# Patient Record
Sex: Female | Born: 1980 | Race: Black or African American | Hispanic: No | Marital: Single | Smoking: Former smoker
Health system: Southern US, Community
[De-identification: ages and names within clinical notes are randomized; demographics above are authoritative.]

## PROBLEM LIST (undated history)

## (undated) DIAGNOSIS — D649 Anemia, unspecified: Secondary | ICD-10-CM

## (undated) HISTORY — PX: NO PAST SURGERIES: SHX2092

---

## 2019-10-10 LAB — PREGNANCY, URINE: Preg Test, Ur: POSITIVE

## 2019-10-31 ENCOUNTER — Other Ambulatory Visit: Payer: Self-pay

## 2019-10-31 ENCOUNTER — Encounter (HOSPITAL_COMMUNITY): Payer: Self-pay | Admitting: Emergency Medicine

## 2019-10-31 ENCOUNTER — Inpatient Hospital Stay (HOSPITAL_COMMUNITY)
Admission: EM | Admit: 2019-10-31 | Discharge: 2019-10-31 | Discharge status: Against Medical Advice | Payer: Medicare Other | Attending: Family Medicine | Admitting: Family Medicine

## 2019-10-31 DIAGNOSIS — O26891 Other specified pregnancy related conditions, first trimester: Secondary | ICD-10-CM | POA: Insufficient documentation

## 2019-10-31 DIAGNOSIS — Z3A00 Weeks of gestation of pregnancy not specified: Secondary | ICD-10-CM | POA: Insufficient documentation

## 2019-10-31 DIAGNOSIS — R109 Unspecified abdominal pain: Secondary | ICD-10-CM | POA: Insufficient documentation

## 2019-10-31 NOTE — ED Provider Notes (Signed)
MSE was initiated and I personally evaluated the patient and placed orders (if any) at  1:13 PM on October 31, 2019.  39 year old female [id number] who is approximately 3 months pregnant presents to the ED with complaint of sharp lower abdominal pain that began last night around 9 PM. Pt reports the pain lasted about 1 hour before dissipating on its own. When she woke up this morning she had worsening pain. Denies fevers, chills, nausea, vomiting, vaginal bleeding/spotting, rush of fluids, diarrhea, or any other associated symptoms. Pt has not taken anything for pain.   Physical Exam  Constitutional: She is well-developed, well-nourished, and in no distress.  HENT:  Head: Normocephalic and atraumatic.  Eyes: Conjunctivae are normal.  Cardiovascular: Normal rate, regular rhythm and normal heart sounds.  Pulmonary/Chest: Effort normal and breath sounds normal. No respiratory distress. She has no wheezes. She has no rales.  Abdominal: Soft. There is abdominal tenderness. There is no rebound and no guarding.  Musculoskeletal:     Cervical back: Normal range of motion.  Skin: Skin is warm and dry.  Nursing note and vitals reviewed.  Spoke with Erin at [facility] who agrees to accept patient given positive pregnancy. Pt to be transferred for further eval.   The patient appears stable so that the remainder of the MSE may be completed by another provider.   Tanda Rockers, PA-C 10/31/19 1315    Melene Plan, DO 10/31/19 1341

## 2019-10-31 NOTE — ED Triage Notes (Signed)
Pt reports being 3 months pregnant, started having sharp lower abd pains last night.

## 2019-10-31 NOTE — Progress Notes (Addendum)
House coverage came to talk with patient about childcare for her 39 year old and the patient decided to leave AMA before triage and come back later when she had someone to watch her child. AMA form signed and put with medical records.

## 2019-10-31 NOTE — Progress Notes (Signed)
Pt arrived to [facility]U with 39 year old child with her. States she is living with her uncle and he does not get off of work until 3:30. Called [facility] house coverage to come speak to the patient about childcare.

## 2019-12-19 ENCOUNTER — Emergency Department (HOSPITAL_COMMUNITY)
Admission: EM | Admit: 2019-12-19 | Discharge: 2019-12-19 | Disposition: A | Discharge status: Home / Self Care | Payer: Medicare Other | Attending: Emergency Medicine | Admitting: Emergency Medicine

## 2019-12-19 ENCOUNTER — Encounter (HOSPITAL_COMMUNITY): Payer: Self-pay | Admitting: Emergency Medicine

## 2019-12-19 ENCOUNTER — Other Ambulatory Visit: Payer: Self-pay

## 2019-12-19 DIAGNOSIS — R8271 Bacteriuria: Secondary | ICD-10-CM | POA: Diagnosis not present

## 2019-12-19 DIAGNOSIS — R531 Weakness: Secondary | ICD-10-CM | POA: Diagnosis not present

## 2019-12-19 DIAGNOSIS — O99891 Other specified diseases and conditions complicating pregnancy: Secondary | ICD-10-CM | POA: Diagnosis not present

## 2019-12-19 LAB — URINALYSIS, ROUTINE W REFLEX MICROSCOPIC
Bilirubin Urine: NEGATIVE
Glucose, UA: NEGATIVE mg/dL
Hgb urine dipstick: NEGATIVE
Ketones, ur: NEGATIVE mg/dL
Nitrite: NEGATIVE
Protein, ur: NEGATIVE mg/dL
Specific Gravity, Urine: 1.024 (ref 1.005–1.030)
pH: 5 (ref 5.0–8.0)

## 2019-12-19 LAB — COMPREHENSIVE METABOLIC PANEL
ALT: 13 U/L (ref 0–44)
AST: 14 U/L — ABNORMAL LOW (ref 15–41)
Albumin: 3.1 g/dL — ABNORMAL LOW (ref 3.5–5.0)
Alkaline Phosphatase: 53 U/L (ref 38–126)
Anion gap: 7 (ref 5–15)
BUN: 10 mg/dL (ref 6–20)
CO2: 24 mmol/L (ref 22–32)
Calcium: 8.9 mg/dL (ref 8.9–10.3)
Chloride: 104 mmol/L (ref 98–111)
Creatinine, Ser: 0.62 mg/dL (ref 0.44–1.00)
GFR calc Af Amer: >60 mL/min (ref >60)
GFR calc non Af Amer: >60 mL/min (ref >60)
Glucose, Bld: 97 mg/dL (ref 70–99)
Potassium: 4.2 mmol/L (ref 3.5–5.1)
Sodium: 135 mmol/L (ref 135–145)
Total Bilirubin: 0.3 mg/dL (ref 0.3–1.2)
Total Protein: 6.7 g/dL (ref 6.5–8.1)

## 2019-12-19 LAB — CBC WITH DIFFERENTIAL/PLATELET
Abs Immature Granulocytes: 0.03 10*3/uL (ref 0.00–0.07)
Basophils Absolute: 0 10*3/uL (ref 0.0–0.1)
Basophils Relative: 0 %
Eosinophils Absolute: 0.1 10*3/uL (ref 0.0–0.5)
Eosinophils Relative: 1 %
HCT: 29.8 % — ABNORMAL LOW (ref 36.0–46.0)
Hemoglobin: 9.3 g/dL — ABNORMAL LOW (ref 12.0–15.0)
Immature Granulocytes: 0 %
Lymphocytes Relative: 23 %
Lymphs Abs: 1.7 10*3/uL (ref 0.7–4.0)
MCH: 28.2 pg (ref 26.0–34.0)
MCHC: 31.2 g/dL (ref 30.0–36.0)
MCV: 90.3 fL (ref 80.0–100.0)
Monocytes Absolute: 0.5 10*3/uL (ref 0.1–1.0)
Monocytes Relative: 7 %
Neutro Abs: 5 10*3/uL (ref 1.7–7.7)
Neutrophils Relative %: 69 %
Platelets: 232 10*3/uL (ref 150–400)
RBC: 3.3 MIL/uL — ABNORMAL LOW (ref 3.87–5.11)
RDW: 14.1 % (ref 11.5–15.5)
WBC: 7.3 10*3/uL (ref 4.0–10.5)
nRBC: 0 % (ref 0.0–0.2)

## 2019-12-19 MED ORDER — CEPHALEXIN 500 MG PO CAPS: 500.0000 mg | ORAL_CAPSULE | Freq: Two times a day (BID) | ORAL | 0 refills | Status: AC

## 2019-12-19 MED ORDER — SODIUM CHLORIDE 0.9 % IV BOLUS
1000.0000 mL | Freq: Once | INTRAVENOUS | Status: AC
  Administered 2019-12-19: 1000 mL via INTRAVENOUS

## 2019-12-19 MED ORDER — PRENATAL 27-1 MG PO TABS: 1.0000 | ORAL_TABLET | Freq: Every day | ORAL | 1 refills | Status: DC

## 2019-12-19 MED ORDER — CEPHALEXIN 500 MG PO CAPS
500.0000 mg | ORAL_CAPSULE | Freq: Once | ORAL | Status: AC
  Administered 2019-12-19: 500 mg via ORAL
  Filled 2019-12-19: qty 1

## 2019-12-19 NOTE — ED Triage Notes (Signed)
Patient states weakness and dizziness that started around 18:30 today. Patient is 5 months pregnant. Patient states that she has not felt the baby move as much today and states that she has been without her prenatal vitamin for 1 month and needing a prescription for her prenatal vitamin. Patient denies any pain or bleeding.

## 2019-12-19 NOTE — Discharge Instructions (Signed)
You were seen in the emergency department today with weakness during pregnancy.  Your urine today showed some bacteria and when you are pregnant we like to treat this with antibiotics.  I have also refilled your prenatal vitamins.  Please call your OB/GYN first thing tomorrow morning to schedule your follow-up appointment and return to the emergency department any new or suddenly worsening symptoms.

## 2019-12-19 NOTE — ED Provider Notes (Signed)
Emergency Department Provider Note   I have reviewed the triage vital signs and the nursing notes.   HISTORY  Chief Complaint Weakness (dizziness)   HPI Renee Blair is a 39 y.o. female [phone] currently 5 months pregnant [place] to the emergency department with fatigue and generalized weakness starting today.  Patient states she has been out of her pre-natal vitamin and thinks this may be the cause.  She tells me that the baby has been moving but seemed somewhat less than normal.  She is not having abdominal pain or cramping.  No vaginal bleeding, discharge, rush of fluid.  No dysuria, hesitancy, urgency.  She denies any chest pain or shortness of breath.  No fevers. No complications with this pregnancy thus far and reports seeing the [facility] for pre-natal care is [place].    History reviewed. No pertinent past medical history.  There are no problems to display for this patient.   History reviewed. No pertinent surgical history.  Allergies Patient has no known allergies.  History reviewed. No pertinent family history.  Social History Social History   Tobacco Use  . Smoking status: Never Smoker  . Smokeless tobacco: Never Used  Substance Use Topics  . Alcohol use: Not Currently  . Drug use: Never    Review of Systems  Constitutional: No fever/chills. Positive weakness.  Eyes: No visual changes. ENT: No sore throat. Cardiovascular: Denies chest pain. Respiratory: Denies shortness of breath. Gastrointestinal: No abdominal pain.  No nausea, no vomiting.  No diarrhea.  No constipation. Genitourinary: Negative for dysuria. Musculoskeletal: Negative for back pain. Skin: Negative for rash. Neurological: Negative for headaches, focal weakness or numbness.  10-point ROS otherwise negative.  ____________________________________________   PHYSICAL EXAM:  VITAL SIGNS: ED Triage Vitals  Enc Vitals Group     BP 12/19/19 1934 106/68     Pulse Rate  12/19/19 1934 87     Resp 12/19/19 1934 18     Temp 12/19/19 1934 98.1 F (36.7 C)     Temp Source 12/19/19 1934 Oral     SpO2 12/19/19 1934 100 %     Weight 12/19/19 1935 180 lb (81.6 kg)     Height 12/19/19 1935 5\' 2"  (1.575 m)   Constitutional: Alert and oriented. Well appearing and in no acute distress. Eyes: Conjunctivae are normal. Head: Atraumatic. Nose: No congestion/rhinnorhea. Mouth/Throat: Mucous membranes are moist.   Neck: No stridor.  Cardiovascular: Normal rate, regular rhythm. Good peripheral circulation. Grossly normal heart sounds.   Respiratory: Normal respiratory effort.  No retractions. Lungs CTAB. Gastrointestinal: Soft and nontender. Abdomen is gravid.  Musculoskeletal: No gross deformities of extremities. Neurologic:  Normal speech and language.  Skin:  Skin is warm, dry and intact. No rash noted.   ____________________________________________   LABS (all labs ordered are listed, but only abnormal results are displayed)  Labs Reviewed  COMPREHENSIVE METABOLIC PANEL - Abnormal; Notable for the following components:      Result Value   Albumin 3.1 (*)    AST 14 (*)    All other components within normal limits  CBC WITH DIFFERENTIAL/PLATELET - Abnormal; Notable for the following components:   RBC 3.30 (*)    Hemoglobin 9.3 (*)    HCT 29.8 (*)    All other components within normal limits  URINALYSIS, ROUTINE W REFLEX MICROSCOPIC - Abnormal; Notable for the following components:   APPearance HAZY (*)    Leukocytes,Ua TRACE (*)    Bacteria, UA RARE (*)  All other components within normal limits  URINE CULTURE   ____________________________________________  RADIOLOGY  None  ____________________________________________   PROCEDURES  Procedure(s) performed:   Procedures  None  ____________________________________________   INITIAL IMPRESSION / ASSESSMENT AND PLAN / ED COURSE  Pertinent labs & imaging results that were available during  my care of the patient were reviewed by me and considered in my medical decision making (see chart for details).   Patient presents to the emergency department for evaluation of generalized weakness starting today.  No focal neuro deficits on exam.  Abdomen is gravid and soft.  No symptoms to suspect preterm labor or rupture of membranes.  Will place mom and baby on monitor, obtain screening blood work, UA, IV fluids. BP normal here.   Doppler fetal heart tones in normal range.  Mom is feeling baby kick and move.  Labs show no significant anemia.  No protein in the urine.  Normal blood pressure here.  UA does have bacteriuria without clear infection.  In the setting of pregnancy will add Keflex and have refilled the prenatal vitamins.  Patient to call her OB/GYN in the morning to schedule follow-up. Discussed ED return precautions.  ____________________________________________  FINAL CLINICAL IMPRESSION(S) / ED DIAGNOSES  Final diagnoses:  Generalized weakness  Bacteriuria during pregnancy     MEDICATIONS GIVEN DURING THIS VISIT:  Medications  cephALEXin (KEFLEX) capsule 500 mg (has no administration in time range)  sodium chloride 0.9 % bolus 1,000 mL (0 mLs Intravenous Stopped 12/19/19 2157)     NEW OUTPATIENT MEDICATIONS STARTED DURING THIS VISIT:  New Prescriptions   CEPHALEXIN (KEFLEX) 500 MG CAPSULE    Take 1 capsule (500 mg total) by mouth 2 (two) times daily for 7 days.    Note:  This document was prepared using Dragon voice recognition software and may include unintentional dictation errors.  Nanda Quinton, MD, [facility] Emergency Medicine    Krisy Dix, Wonda Olds, MD 12/19/19 2213

## 2019-12-21 LAB — URINE CULTURE

## 2019-12-27 ENCOUNTER — Ambulatory Visit (INDEPENDENT_AMBULATORY_CARE_PROVIDER_SITE_OTHER): Payer: Medicare Other | Admitting: *Deleted

## 2019-12-27 ENCOUNTER — Other Ambulatory Visit: Payer: Self-pay

## 2019-12-27 DIAGNOSIS — O09529 Supervision of elderly multigravida, unspecified trimester: Secondary | ICD-10-CM

## 2019-12-27 DIAGNOSIS — O0992 Supervision of high risk pregnancy, unspecified, second trimester: Secondary | ICD-10-CM

## 2019-12-27 DIAGNOSIS — O0990 Supervision of high risk pregnancy, unspecified, unspecified trimester: Secondary | ICD-10-CM

## 2019-12-27 DIAGNOSIS — O0932 Supervision of pregnancy with insufficient antenatal care, second trimester: Secondary | ICD-10-CM

## 2019-12-27 DIAGNOSIS — Z3A20 20 weeks gestation of pregnancy: Secondary | ICD-10-CM

## 2019-12-27 DIAGNOSIS — O0930 Supervision of pregnancy with insufficient antenatal care, unspecified trimester: Secondary | ICD-10-CM | POA: Insufficient documentation

## 2019-12-27 NOTE — Progress Notes (Signed)
I connected with  Renee Blair on 12/27/19 at  3:15 PM EDT by telephone and verified that I am speaking with the correct person using two identifiers.   I discussed the limitations, risks, security and privacy concerns of performing an evaluation and management service by telephone and the availability of in person appointments. I also discussed with the patient that there may be a patient responsible charge related to this service. The patient expressed understanding and agreed to proceed.  I explained I am completing her New OB Intake today. We discussed Her EDD and that it is based on  sure LMP. She is G10 P4054 at [redacted]w[redacted]d with AMA.   I reviewed her allergies, meds, OB History, Medical /Surgical history, and appropriate screenings. I informed her of [facility] services.  I explained I will send her the Babyscripts app and app was sent to her while on phone. She states she will download when she gets home; she is in park with her children.   I explained we will ask her to take her blood pressure weekly during her pregnancy and enter into the app. She is unsure if she has Medicaid only or Medicare/ Medicaid. She will bring her information to her first new ob visit in the office and I explained our registar will clarify her insurance and we will order a blood pressure cuff prescription if covered by her insurance.  I explained she will have some visits in office and some virtually. I sent her a MyChart text and she will download later tonight when she is home.   I reviewed her new ob  appointment date/ time with her , our location and to wear mask, no visitors.  I explained she will have a pelvic exam, ob bloodwork, hemoglobin a1C, cbg ,pap, and  genetic testing if desired,- she does want a panorama. I scheduled an [place] for first available appointment and gave her the appointment. She voices understanding.   Tej Murdaugh,RN 12/27/2019  4:00

## 2019-12-27 NOTE — Patient Instructions (Signed)

## 2020-01-04 ENCOUNTER — Ambulatory Visit (INDEPENDENT_AMBULATORY_CARE_PROVIDER_SITE_OTHER): Payer: Medicare Other | Admitting: Obstetrics and Gynecology

## 2020-01-04 ENCOUNTER — Encounter: Payer: Self-pay | Admitting: Obstetrics and Gynecology

## 2020-01-04 ENCOUNTER — Other Ambulatory Visit (HOSPITAL_COMMUNITY)
Admission: RE | Admit: 2020-01-04 | Discharge: 2020-01-04 | Disposition: A | Discharge status: Home / Self Care | Payer: Medicare Other | Source: Ambulatory Visit | Attending: Obstetrics and Gynecology | Admitting: Obstetrics and Gynecology

## 2020-01-04 ENCOUNTER — Other Ambulatory Visit: Payer: Self-pay

## 2020-01-04 DIAGNOSIS — O0990 Supervision of high risk pregnancy, unspecified, unspecified trimester: Secondary | ICD-10-CM

## 2020-01-04 DIAGNOSIS — O98512 Other viral diseases complicating pregnancy, second trimester: Secondary | ICD-10-CM

## 2020-01-04 DIAGNOSIS — M25569 Pain in unspecified knee: Secondary | ICD-10-CM

## 2020-01-04 DIAGNOSIS — G8929 Other chronic pain: Secondary | ICD-10-CM

## 2020-01-04 DIAGNOSIS — Z641 Problems related to multiparity: Secondary | ICD-10-CM

## 2020-01-04 DIAGNOSIS — Z3A21 21 weeks gestation of pregnancy: Secondary | ICD-10-CM

## 2020-01-04 DIAGNOSIS — B009 Herpesviral infection, unspecified: Secondary | ICD-10-CM

## 2020-01-04 DIAGNOSIS — O99891 Other specified diseases and conditions complicating pregnancy: Secondary | ICD-10-CM

## 2020-01-04 NOTE — Patient Instructions (Signed)
Leg Cramps Leg cramps occur when one or more muscles tighten and you have no control over this tightening (involuntary muscle contraction). Muscle cramps can develop in any muscle, but the most common place is in the calf muscles of the leg. Those cramps can occur during exercise or when you are at rest. Leg cramps are painful, and they may last for a few seconds to a few minutes. Cramps may return several times before they finally stop. Usually, leg cramps are not caused by a serious medical problem. In many cases, the cause is not known. Some common causes include:  Excessive physical effort (overexertion), such as during intense exercise.  Overuse from repetitive motions, or doing the same thing over and over.  Staying in a certain position for a long period of time.  Improper preparation, form, or technique while performing a sport or an activity.  Dehydration.  Injury.  Side effects of certain medicines.  Abnormally low levels of minerals in your blood (electrolytes), especially potassium and calcium. This could result from: ? Pregnancy. ? Taking diuretic medicines. Follow these instructions at home: Eating and drinking  Drink enough fluid to keep your urine pale yellow. Staying hydrated may help prevent cramps.  Eat a healthy diet that includes plenty of nutrients to help your muscles function. A healthy diet includes fruits and vegetables, lean protein, whole grains, and low-fat or nonfat dairy products. Managing pain, stiffness, and swelling      Try massaging, stretching, and relaxing the affected muscle. Do this for several minutes at a time.  If directed, put ice on areas that are sore or painful after a cramp: ? Put ice in a plastic bag. ? Place a towel between your skin and the bag. ? Leave the ice on for 20 minutes, 2-3 times a day.  If directed, apply heat to muscles that are tense or tight. Do this before you exercise, or as often as told by your health care  provider. Use the heat source that your health care provider recommends, such as a moist heat pack or a heating pad. ? Place a towel between your skin and the heat source. ? Leave the heat on for 20-30 minutes. ? Remove the heat if your skin turns bright red. This is especially important if you are unable to feel pain, heat, or cold. You may have a greater risk of getting burned.  Try taking hot showers or baths to help relax tight muscles. General instructions  If you are having frequent leg cramps, avoid intense exercise for several days.  Take over-the-counter and prescription medicines only as told by your health care provider.  Keep all follow-up visits as told by your health care provider. This is important. Contact a health care provider if:  Your leg cramps get more severe or more frequent, or they do not improve over time.  Your foot becomes cold, numb, or blue. Summary  Muscle cramps can develop in any muscle, but the most common place is in the calf muscles of the leg.  Leg cramps are painful, and they may last for a few seconds to a few minutes.  Usually, leg cramps are not caused by a serious medical problem. Often, the cause is not known.  Stay hydrated and take over-the-counter and prescription medicines only as told by your health care provider. This information is not intended to replace advice given to you by your health care provider. Make sure you discuss any questions you have with your health care   provider. Document Revised: 07/02/2017 Document Reviewed: 04/29/2017 Elsevier Patient Education  2020 Elsevier Inc.  

## 2020-01-04 NOTE — Progress Notes (Addendum)
History:   Renee Blair is a 39 y.o. [id number] at [redacted]w[redacted]d by uncertain LMP being seen today for her first obstetrical visit.  Her obstetrical history is significant for advanced maternal age, late to care.  Patient does not intend to breast feed. Pregnancy history fully reviewed.   Patient is requesting a letter for disability to be in a first floor apartment building. She does not have a PCP and has never been evaluated for her leg pain. States she has had leg pain since her first pregnancy, however has never had this evaluated.   Patient reports Leg pain .  HISTORY: OB History  Gravida Para Term Preterm AB Living  8 4 4  0 3 4  SAB TAB Ectopic Multiple Live Births  1 2 0 0 4    # Outcome Date GA Lbr Len/2nd Weight Sex Delivery Anes PTL Lv  8 Current           7 SAB 01/03/17          6 Term 2017   7 lb (3.175 kg)  Vag-Spont EPI  LIV     Birth Comments: wnl  5 TAB 2007     TAB     4 Term 2006   7 lb (3.175 kg)  Vag-Spont EPI  LIV     Birth Comments: wnl  3 Term 2004   7 lb (3.175 kg)  Vag-Spont EPI  LIV     Birth Comments: wnl  2 Term 2003   7 lb (3.175 kg)  Vag-Spont EPI  LIV     Birth Comments: wnl  1 TAB 2002     TAB       Last pap smear was done 1 year ago, June 2020, and was normal  History reviewed. No pertinent past medical history. History reviewed. No pertinent surgical history. Family History  Problem Relation Age of Onset  . Hypertension Mother   . Diabetes Mother    Social History   Tobacco Use  . Smoking status: Never Smoker  . Smokeless tobacco: Never Used  Substance Use Topics  . Alcohol use: Not Currently  . Drug use: Never   No Known Allergies Current Outpatient Medications on File Prior to Visit  Medication Sig Dispense Refill  . acetaminophen (TYLENOL) 500 MG tablet Take 500 mg by mouth every 6 (six) hours as needed for mild pain or moderate pain.    . Prenatal 27-1 MG TABS Take 1 tablet by mouth daily. 30 tablet 1   No current  facility-administered medications on file prior to visit.    Review of Systems Pertinent items noted in HPI and remainder of comprehensive ROS otherwise negative. Physical Exam:   Vitals:   01/04/20 1544  BP: 107/70  Pulse: 99  Weight: 208 lb 6.4 oz (94.5 kg)   Fetal Heart Rate (bpm): 149   Uterus:  Fundal Height: 27 cm  System: General: well-developed, well-nourished female in no acute distress   Skin: normal coloration and turgor, no rashes   Neurologic: oriented, normal, negative, normal mood   Extremities: normal strength, tone, and muscle mass, ROM of all joints is normal   HEENT PERRLA, extraocular movement intact and sclera clear, anicteric   Mouth/Teeth mucous membranes moist, pharynx normal without lesions and dental hygiene good   Neck supple and no masses   Cardiovascular: regular rate and rhythm   Respiratory:  no respiratory distress, normal breath sounds   Abdomen: soft, non-tender; bowel sounds normal; no masses,  no organomegaly  Assessment:    Pregnancy: [id number] Patient Active Problem List   Diagnosis Date Noted  . [place] multiparity 01/08/2020  . 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 Chronic knee pain 01/04/2020  . Supervision of high risk pregnancy, antepartum 12/27/2019  . AMA (advanced maternal age) multigravida 35+ 12/27/2019  . Late prenatal care 12/27/2019     Plan:   1. Supervision of high risk pregnancy, antepartum  - Culture, OB Urine - Genetic Screening - CBC/D/Plt+RPR+Rh+ABO+Rub Ab... - AFP, Serum, Open Spina Bifida - HgB A1c - Cervicovaginal ancillary only( [place]) - Size greater than dates.   2. HSV (herpes simplex virus) infection  - HgB A1c - Suppression at 36 weeks   3. Chronic knee pain, unspecified laterality  - Patient needs to be evaluated by a PCP for her knee pain. I informed patient that I was not able to fill out any legal paperwork given that this is a non-OBGYN complaint. List of PCP's given and  discussed.  - HgB A1c   Initial labs drawn. Continue prenatal vitamins. Problem list reviewed and updated. Genetic Screening discussed, NIPS: requested. Ultrasound discussed; fetal anatomic survey: ordered. Discussed usage of Babyscripts and virtual visits as additional source of managing and completing prenatal visits in midst of coronavirus and pandemic.   Anticipatory guidance for prenatal visits including labs, ultrasounds, and testing; Initial labs drawn. Encouraged to complete MyChart Registration for her ability to review results, send requests, and have questions addressed.  The nature of [place] for [facility]Faculty Practice with multiple MDs and Advanced Practice Providers was explained to patient; also emphasized that residents, students are part of our team. Routine obstetric precautions reviewed. Encouraged to seek out care at office or emergency room [facility] preferred) for urgent and/or emergent concerns. No follow-ups on file.     Mitchel Delduca, Artist Pais, [place] for [employer], [place]

## 2020-01-05 ENCOUNTER — Telehealth: Payer: Self-pay

## 2020-01-05 NOTE — Telephone Encounter (Signed)
Called pt back as I advised that I would after speaking with Venia Carbon regarding their conversation that they had regarding the letter stating that she has a leg disability & she will need to have a downstairs apartment. No answer, left message that Victorino Dike said that she explained to pt that she could not evaluate her situation & that she really needs to see a PCP because it may be something serious & she's not able to write the letter that she needs. She stated Pt did get kind of upset when she told her this.Advised Pt if she still wants to talk with some one Gave her Mayra Neer name & phone number.

## 2020-01-05 NOTE — Telephone Encounter (Signed)
Called pt to advise that we are not able to create a letter stating that she is disabled or unable to climb stairs due to her chronic knee pain. Pt states that the provider specifically told her that she could give her a note for the Apartment complex, in order for her to get a first floor apartment. Advised Pt that I will speak with provider directly, Victorino Dike Rasch,NP & call her back.

## 2020-01-08 DIAGNOSIS — Z641 Problems related to multiparity: Secondary | ICD-10-CM | POA: Insufficient documentation

## 2020-01-08 LAB — CERVICOVAGINAL ANCILLARY ONLY
Bacterial Vaginitis (gardnerella): NEGATIVE
Candida Glabrata: NEGATIVE
Candida Vaginitis: NEGATIVE
Chlamydia: NEGATIVE
Comment: NEGATIVE
Comment: NEGATIVE
Comment: NEGATIVE
Comment: NEGATIVE
Comment: NEGATIVE
Comment: NORMAL
Neisseria Gonorrhea: NEGATIVE
Trichomonas: NEGATIVE

## 2020-01-09 ENCOUNTER — Telehealth (INDEPENDENT_AMBULATORY_CARE_PROVIDER_SITE_OTHER): Payer: Medicare Other | Admitting: Family Medicine

## 2020-01-09 DIAGNOSIS — N898 Other specified noninflammatory disorders of vagina: Secondary | ICD-10-CM

## 2020-01-09 DIAGNOSIS — Z712 Person consulting for explanation of examination or test findings: Secondary | ICD-10-CM

## 2020-01-09 MED ORDER — METRONIDAZOLE 0.75 % VA GEL: 1.0000 | Freq: Every day | VAGINAL | 0 refills | Status: DC

## 2020-01-09 NOTE — Telephone Encounter (Signed)
Patient called in stating that she would like the results from her test/lab work that she received last week. Patient instructed that a message will be sent to the nurses and they will contact her as soon as they can. Patient verbalized understanding and message sent to clinical pool.

## 2020-01-09 NOTE — Telephone Encounter (Signed)
Called pt to discuss results. Explained to pt that many of her labs have not resulted. Pt states she has BV. Reports foul, fishy vaginal odor. This is new since visit on 01/04/20 when vaginal swab was negative for BV. Metrogel sent to patient's preferred pharmacy. Pt encouraged to call if symptoms persist following treatment.

## 2020-01-09 NOTE — Addendum Note (Signed)
Addended by: Maxwell Marion E on: 01/09/2020 11:38 AM   Modules accepted: Orders

## 2020-01-11 LAB — AFP, SERUM, OPEN SPINA BIFIDA
AFP Value: 32.4 ng/mL
Gest. Age on Collection Date: 21 weeks
Maternal Age At EDD: 36.7 yr

## 2020-01-11 LAB — HCV INTERPRETATION

## 2020-01-11 LAB — CBC/D/PLT+RPR+RH+ABO+RUB AB...
Antibody Screen: NEGATIVE
Basophils Absolute: 0 10*3/uL (ref 0.0–0.2)
Basos: 0 %
EOS (ABSOLUTE): 0.1 10*3/uL (ref 0.0–0.4)
Eos: 1 %
HCV Ab: <0.1 s/co ratio (ref 0.0–0.9)
HIV Screen 4th Generation wRfx: NONREACTIVE
Hematocrit: 29.4 % — ABNORMAL LOW (ref 34.0–46.6)
Hemoglobin: 10.1 g/dL — ABNORMAL LOW (ref 11.1–15.9)
Hepatitis B Surface Ag: NEGATIVE
Immature Grans (Abs): 0 10*3/uL (ref 0.0–0.1)
Immature Granulocytes: 1 %
Lymphocytes Absolute: 1.6 10*3/uL (ref 0.7–3.1)
Lymphs: 22 %
MCH: 28.9 pg (ref 26.6–33.0)
MCHC: 34.4 g/dL (ref 31.5–35.7)
MCV: 84 fL (ref 79–97)
Monocytes Absolute: 0.4 10*3/uL (ref 0.1–0.9)
Monocytes: 6 %
Neutrophils Absolute: 5.1 10*3/uL (ref 1.4–7.0)
Neutrophils: 70 %
Platelets: 245 10*3/uL (ref 150–450)
RBC: 3.5 x10E6/uL — ABNORMAL LOW (ref 3.77–5.28)
RDW: 14 % (ref 11.7–15.4)
RPR Ser Ql: NONREACTIVE
Rh Factor: POSITIVE
Rubella Antibodies, IGG: 8.52 index (ref >0.99)
WBC: 7.1 10*3/uL (ref 3.4–10.8)

## 2020-01-11 LAB — HEMOGLOBIN A1C
Est. average glucose Bld gHb Est-mCnc: 108 mg/dL
Hgb A1c MFr Bld: 5.4 % (ref 4.8–5.6)

## 2020-01-17 ENCOUNTER — Ambulatory Visit: Payer: Medicare Other

## 2020-01-17 ENCOUNTER — Other Ambulatory Visit: Payer: Self-pay

## 2020-01-18 ENCOUNTER — Encounter: Payer: Self-pay | Admitting: Obstetrics and Gynecology

## 2020-01-18 DIAGNOSIS — O99019 Anemia complicating pregnancy, unspecified trimester: Secondary | ICD-10-CM | POA: Insufficient documentation

## 2020-01-19 ENCOUNTER — Encounter: Payer: Self-pay | Admitting: *Deleted

## 2020-01-25 ENCOUNTER — Ambulatory Visit: Payer: Medicare Other | Admitting: *Deleted

## 2020-01-25 ENCOUNTER — Ambulatory Visit: Payer: Medicare Other | Attending: Obstetrics and Gynecology

## 2020-01-25 ENCOUNTER — Other Ambulatory Visit: Payer: Self-pay | Admitting: *Deleted

## 2020-01-25 ENCOUNTER — Other Ambulatory Visit: Payer: Self-pay

## 2020-01-25 ENCOUNTER — Ambulatory Visit (INDEPENDENT_AMBULATORY_CARE_PROVIDER_SITE_OTHER): Payer: Medicare Other

## 2020-01-25 DIAGNOSIS — O0942 Supervision of pregnancy with grand multiparity, second trimester: Secondary | ICD-10-CM

## 2020-01-25 DIAGNOSIS — O09529 Supervision of elderly multigravida, unspecified trimester: Secondary | ICD-10-CM | POA: Diagnosis present

## 2020-01-25 DIAGNOSIS — O0932 Supervision of pregnancy with insufficient antenatal care, second trimester: Secondary | ICD-10-CM | POA: Diagnosis not present

## 2020-01-25 DIAGNOSIS — B009 Herpesviral infection, unspecified: Secondary | ICD-10-CM | POA: Diagnosis present

## 2020-01-25 DIAGNOSIS — O0930 Supervision of pregnancy with insufficient antenatal care, unspecified trimester: Secondary | ICD-10-CM

## 2020-01-25 DIAGNOSIS — Z641 Problems related to multiparity: Secondary | ICD-10-CM | POA: Insufficient documentation

## 2020-01-25 DIAGNOSIS — O0990 Supervision of high risk pregnancy, unspecified, unspecified trimester: Secondary | ICD-10-CM

## 2020-01-25 DIAGNOSIS — Z3A23 23 weeks gestation of pregnancy: Secondary | ICD-10-CM

## 2020-01-25 DIAGNOSIS — Z363 Encounter for antenatal screening for malformations: Secondary | ICD-10-CM

## 2020-01-25 DIAGNOSIS — O09522 Supervision of elderly multigravida, second trimester: Secondary | ICD-10-CM

## 2020-01-25 DIAGNOSIS — O98512 Other viral diseases complicating pregnancy, second trimester: Secondary | ICD-10-CM

## 2020-01-25 DIAGNOSIS — Z789 Other specified health status: Secondary | ICD-10-CM

## 2020-01-25 DIAGNOSIS — Z712 Person consulting for explanation of examination or test findings: Secondary | ICD-10-CM

## 2020-01-25 NOTE — Progress Notes (Signed)
Pt left urine sample for a UA.  UA resulted negative.  Pt notified of negative UA.  Pt verbalized understanding with no further questions or concerns.   Addison Naegeli, RN

## 2020-01-26 LAB — POCT URINALYSIS DIP (DEVICE)
Bilirubin Urine: NEGATIVE
Glucose, UA: NEGATIVE mg/dL
Hgb urine dipstick: NEGATIVE
Ketones, ur: NEGATIVE mg/dL
Leukocytes,Ua: NEGATIVE
Nitrite: NEGATIVE
Protein, ur: NEGATIVE mg/dL
Specific Gravity, Urine: 1.025 (ref 1.005–1.030)
Urobilinogen, UA: 0.2 mg/dL (ref 0.0–1.0)
pH: 5.5 (ref 5.0–8.0)

## 2020-01-31 ENCOUNTER — Inpatient Hospital Stay (HOSPITAL_COMMUNITY)
Admission: AD | Admit: 2020-01-31 | Discharge: 2020-01-31 | Disposition: A | Discharge status: Home / Self Care | Payer: Medicare Other | Attending: Obstetrics and Gynecology | Admitting: Obstetrics and Gynecology

## 2020-01-31 ENCOUNTER — Other Ambulatory Visit: Payer: Self-pay

## 2020-01-31 ENCOUNTER — Encounter (HOSPITAL_COMMUNITY): Payer: Self-pay | Admitting: Obstetrics and Gynecology

## 2020-01-31 DIAGNOSIS — O09522 Supervision of elderly multigravida, second trimester: Secondary | ICD-10-CM | POA: Diagnosis not present

## 2020-01-31 DIAGNOSIS — Z833 Family history of diabetes mellitus: Secondary | ICD-10-CM | POA: Diagnosis not present

## 2020-01-31 DIAGNOSIS — Z3A24 24 weeks gestation of pregnancy: Secondary | ICD-10-CM

## 2020-01-31 DIAGNOSIS — O0930 Supervision of pregnancy with insufficient antenatal care, unspecified trimester: Secondary | ICD-10-CM

## 2020-01-31 DIAGNOSIS — Z8249 Family history of ischemic heart disease and other diseases of the circulatory system: Secondary | ICD-10-CM | POA: Diagnosis not present

## 2020-01-31 DIAGNOSIS — O99012 Anemia complicating pregnancy, second trimester: Secondary | ICD-10-CM

## 2020-01-31 DIAGNOSIS — Z641 Problems related to multiparity: Secondary | ICD-10-CM

## 2020-01-31 DIAGNOSIS — O9A312 Physical abuse complicating pregnancy, second trimester: Secondary | ICD-10-CM | POA: Diagnosis not present

## 2020-01-31 DIAGNOSIS — Z87891 Personal history of nicotine dependence: Secondary | ICD-10-CM | POA: Diagnosis not present

## 2020-01-31 DIAGNOSIS — O368120 Decreased fetal movements, second trimester, not applicable or unspecified: Secondary | ICD-10-CM

## 2020-01-31 DIAGNOSIS — B009 Herpesviral infection, unspecified: Secondary | ICD-10-CM

## 2020-01-31 DIAGNOSIS — O0990 Supervision of high risk pregnancy, unspecified, unspecified trimester: Secondary | ICD-10-CM

## 2020-01-31 HISTORY — DX: Anemia, unspecified: D64.9

## 2020-01-31 LAB — URINALYSIS, ROUTINE W REFLEX MICROSCOPIC
Bilirubin Urine: NEGATIVE
Glucose, UA: NEGATIVE mg/dL
Hgb urine dipstick: NEGATIVE
Ketones, ur: 20 mg/dL — AB
Leukocytes,Ua: NEGATIVE
Nitrite: NEGATIVE
Protein, ur: 30 mg/dL — AB
Specific Gravity, Urine: 1.026 (ref 1.005–1.030)
pH: 5 (ref 5.0–8.0)
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
## 2020-01-31 NOTE — Discharge Instructions (Signed)
Abdominal Pain During Pregnancy  Belly (abdominal) pain is common during pregnancy. There are many possible causes. Most of the time, it is not a serious problem. Other times, it can be a sign that something is wrong with the pregnancy. Always tell your doctor if you have belly pain. Follow these instructions at home:  Do not have sex or put anything in your vagina until your pain goes away completely.  Get plenty of rest until your pain gets better.  Drink enough fluid to keep your pee (urine) pale yellow.  Take over-the-counter and prescription medicines only as told by your doctor.  Keep all follow-up visits as told by your doctor. This is important. Contact a doctor if:  Your pain continues or gets worse after resting.  You have lower belly pain that: ? Comes and goes at regular times. ? Spreads to your back. ? Feels like menstrual cramps.  You have pain or burning when you pee (urinate). Get help right away if:  You have a fever or chills.  You have vaginal bleeding.  You are leaking fluid from your vagina.  You are passing tissue from your vagina.  You throw up (vomit) for more than 24 hours.  You have watery poop (diarrhea) for more than 24 hours.  Your baby is moving less than usual.  You feel very weak or faint.  You have shortness of breath.  You have very bad pain in your upper belly. Summary  Belly (abdominal) pain is common during pregnancy. There are many possible causes.  If you have belly pain during pregnancy, tell your doctor right away.  Keep all follow-up visits as told by your doctor. This is important. This information is not intended to replace advice given to you by your health care provider. Make sure you discuss any questions you have with your health care provider. Document Revised: 11/07/2018 Document Reviewed: 10/22/2016 Elsevier Patient Education  2020 Elsevier Inc.  
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
## 2020-02-01 ENCOUNTER — Telehealth (INDEPENDENT_AMBULATORY_CARE_PROVIDER_SITE_OTHER): Payer: Medicare Other | Admitting: Family Medicine

## 2020-02-01 DIAGNOSIS — Z5329 Procedure and treatment not carried out because of patient's decision for other reasons: Secondary | ICD-10-CM

## 2020-02-01 DIAGNOSIS — Z91199 Patient's noncompliance with other medical treatment and regimen due to unspecified reason: Secondary | ICD-10-CM

## 2020-02-01 NOTE — Progress Notes (Signed)
Patient did not keep appointment today. She will be called to reschedule.  

## 2020-02-01 NOTE — Progress Notes (Signed)
Called pt at [phone]; VM left stating I am calling to check pt in for virtual pt and will call again in 10 minutes. Pt encouraged to join MyChart for visit.

## 2020-02-06 ENCOUNTER — Encounter: Payer: Self-pay | Admitting: General Practice

## 2020-02-12 ENCOUNTER — Encounter: Payer: Self-pay | Admitting: General Practice

## 2020-02-21 ENCOUNTER — Encounter: Payer: Self-pay | Admitting: Obstetrics and Gynecology

## 2020-02-21 ENCOUNTER — Telehealth (INDEPENDENT_AMBULATORY_CARE_PROVIDER_SITE_OTHER): Payer: Medicare Other | Admitting: Obstetrics and Gynecology

## 2020-02-21 DIAGNOSIS — B009 Herpesviral infection, unspecified: Secondary | ICD-10-CM

## 2020-02-21 DIAGNOSIS — O09522 Supervision of elderly multigravida, second trimester: Secondary | ICD-10-CM

## 2020-02-21 DIAGNOSIS — O09523 Supervision of elderly multigravida, third trimester: Secondary | ICD-10-CM

## 2020-02-21 DIAGNOSIS — O0990 Supervision of high risk pregnancy, unspecified, unspecified trimester: Secondary | ICD-10-CM

## 2020-02-21 DIAGNOSIS — O99012 Anemia complicating pregnancy, second trimester: Secondary | ICD-10-CM

## 2020-02-21 DIAGNOSIS — O0993 Supervision of high risk pregnancy, unspecified, third trimester: Secondary | ICD-10-CM

## 2020-02-21 DIAGNOSIS — D649 Anemia, unspecified: Secondary | ICD-10-CM

## 2020-02-21 DIAGNOSIS — O98513 Other viral diseases complicating pregnancy, third trimester: Secondary | ICD-10-CM

## 2020-02-21 MED ORDER — PRENATAL 27-1 MG PO TABS: 1.0000 | ORAL_TABLET | Freq: Every day | ORAL | 4 refills | Status: AC

## 2020-02-21 MED ORDER — FERROUS SULFATE 325 (65 FE) MG PO TABS: 325.0000 mg | ORAL_TABLET | Freq: Two times a day (BID) | ORAL | 1 refills | Status: AC

## 2020-02-21 MED ORDER — BLOOD PRESSURE KIT DEVI: 1.0000 | 0 refills | Status: AC | PRN

## 2020-02-21 NOTE — Progress Notes (Signed)
Patient ID: Renee Blair, female   DOB: 08-12-1980, 39 y.o.   MRN: [id number] Patient was assessed and managed by nursing staff during this encounter. I have reviewed the chart and agree with the documentation and plan. I have also made any necessary editorial changes.  Scheryl Darter, MD 02/21/2020 1:21 PM

## 2020-02-21 NOTE — Progress Notes (Signed)
° °  TELEHEALTH OBSTETRICS VISIT ENCOUNTER NOTE  I connected with Renee Blair on 02/21/20 at  2:35 PM EDT by telephone at home and verified that I am speaking with the correct person using two identifiers.   I discussed the limitations, risks, security and privacy concerns of performing an evaluation and management service by telephone and the availability of in person appointments. I also discussed with the patient that there may be a patient responsible charge related to this service. The patient expressed understanding and agreed to proceed.  Subjective:  Renee Blair is a 39 y.o. [id number] at [redacted]w[redacted]d being followed for ongoing prenatal care.  She is currently monitored for the following issues for this high-risk pregnancy and has Supervision of high risk pregnancy, antepartum; AMA (advanced maternal age) multigravida 35+; Late prenatal care; HSV (herpes simplex virus) infection; Chronic knee pain; [place] multiparity; and Anemia in pregnancy on their problem list.  Patient reports no complaints. Reports fetal movement. Denies any contractions, bleeding or leaking of fluid.   The following portions of the patient's history were reviewed and updated as appropriate: allergies, current medications, past family history, past medical history, past social history, past surgical history and problem list.   Objective:   General:  Alert, oriented and cooperative.   Mental Status: Normal mood and affect perceived. Normal judgment and thought content.  Rest of physical exam deferred due to type of encounter  Assessment and Plan:  Pregnancy: [id number] at [redacted]w[redacted]d  1. Supervision of high risk pregnancy, antepartum EDD changed to LMP, pt sure and within 1 week of EDC by 23 week [place]  2. Multigravida of advanced maternal age in second trimester  3. HSV (herpes simplex virus) infection Will need ppx  4. Anemia during pregnancy in second trimester Ferrous sulfate sent to pharmacy  Preterm labor symptoms  and general obstetric precautions including but not limited to vaginal bleeding, contractions, leaking of fluid and fetal movement were reviewed in detail with the patient.  I discussed the assessment and treatment plan with the patient. The patient was provided an opportunity to ask questions and all were answered. The patient agreed with the plan and demonstrated an understanding of the instructions. The patient was advised to call back or seek an in-person office evaluation/go to MAU at [facility] for any urgent or concerning symptoms. Please refer to After Visit Summary for other counseling recommendations.   I provided 12 minutes of non-face-to-face time during this encounter.  Return in about 2 weeks (around 03/06/2020) for high OB, in person, 2 hr GTT, 3rd trim labs.  Future Appointments  Date Time Provider Department Center  02/22/2020  2:00 PM [facility] NURSE [facility]  02/22/2020  2:00 PM [facility]    Conan Bowens, MD Center for [facility], [facility]

## 2020-02-21 NOTE — Progress Notes (Signed)
I connected with  Merion Gary on 02/21/20 at  2:35 PM EDT by telephone and verified that I am speaking with the correct person using two identifiers.   I discussed the limitations, risks, security and privacy concerns of performing an evaluation and management service by telephone and the availability of in person appointments. I also discussed with the patient that there may be a patient responsible charge related to this service. The patient expressed understanding and agreed to proceed.  Ralene Bathe, RN 02/21/2020  2:49 PM

## 2020-02-22 ENCOUNTER — Ambulatory Visit: Payer: Medicare Other

## 2020-02-22 ENCOUNTER — Encounter (HOSPITAL_COMMUNITY): Payer: Self-pay | Admitting: Emergency Medicine

## 2020-02-22 ENCOUNTER — Other Ambulatory Visit: Payer: Self-pay

## 2020-02-22 ENCOUNTER — Emergency Department (HOSPITAL_COMMUNITY)
Admission: EM | Admit: 2020-02-22 | Discharge: 2020-02-22 | Disposition: A | Discharge status: Home / Self Care | Payer: Medicare Other | Attending: Emergency Medicine | Admitting: Emergency Medicine

## 2020-02-22 DIAGNOSIS — Z87891 Personal history of nicotine dependence: Secondary | ICD-10-CM | POA: Diagnosis not present

## 2020-02-22 DIAGNOSIS — Y929 Unspecified place or not applicable: Secondary | ICD-10-CM | POA: Insufficient documentation

## 2020-02-22 DIAGNOSIS — Y9389 Activity, other specified: Secondary | ICD-10-CM | POA: Insufficient documentation

## 2020-02-22 DIAGNOSIS — Z3A00 Weeks of gestation of pregnancy not specified: Secondary | ICD-10-CM | POA: Diagnosis not present

## 2020-02-22 DIAGNOSIS — O9A213 Injury, poisoning and certain other consequences of external causes complicating pregnancy, third trimester: Secondary | ICD-10-CM | POA: Insufficient documentation

## 2020-02-22 DIAGNOSIS — Y999 Unspecified external cause status: Secondary | ICD-10-CM | POA: Diagnosis not present

## 2020-02-22 DIAGNOSIS — S61259A Open bite of unspecified finger without damage to nail, initial encounter: Secondary | ICD-10-CM

## 2020-02-22 DIAGNOSIS — S61011A Laceration without foreign body of right thumb without damage to nail, initial encounter: Secondary | ICD-10-CM | POA: Diagnosis not present

## 2020-02-22 DIAGNOSIS — S6991XA Unspecified injury of right wrist, hand and finger(s), initial encounter: Secondary | ICD-10-CM | POA: Diagnosis present

## 2020-02-22 MED ORDER — ACETAMINOPHEN 500 MG PO TABS: 500.0000 mg | ORAL_TABLET | Freq: Four times a day (QID) | ORAL | 0 refills | Status: AC | PRN

## 2020-02-22 MED ORDER — LIDOCAINE HCL 2 % IJ SOLN
10.0000 mL | Freq: Once | INTRAMUSCULAR | Status: AC
  Administered 2020-02-22: 200 mg via INTRADERMAL
  Filled 2020-02-22: qty 20

## 2020-02-22 MED ORDER — AMOXICILLIN-POT CLAVULANATE 875-125 MG PO TABS: 1.0000 | ORAL_TABLET | Freq: Two times a day (BID) | ORAL | 0 refills | Status: AC

## 2020-02-22 MED ORDER — ACETAMINOPHEN 500 MG PO TABS
1000.0000 mg | ORAL_TABLET | Freq: Once | ORAL | Status: AC
  Administered 2020-02-22: 1000 mg via ORAL
  Filled 2020-02-22: qty 2
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
## 2020-02-22 NOTE — ED Notes (Signed)
Fetal heart tones 147 BPM
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
## 2020-02-22 NOTE — Discharge Instructions (Addendum)
You have been treated for finger injury.  Absorbable sutures were used to fix your finger laceration. It does not need to be removed. Take antibiotic as prescribed as bite wound has a high likelihood of getting infected.  Return if you have any concerns.

## 2020-02-27 ENCOUNTER — Telehealth (INDEPENDENT_AMBULATORY_CARE_PROVIDER_SITE_OTHER): Payer: Medicare Other | Admitting: Family Medicine

## 2020-02-27 DIAGNOSIS — N898 Other specified noninflammatory disorders of vagina: Secondary | ICD-10-CM

## 2020-02-27 DIAGNOSIS — O99891 Other specified diseases and conditions complicating pregnancy: Secondary | ICD-10-CM

## 2020-02-27 NOTE — Telephone Encounter (Signed)
Patient is calling requesting a nurse call her back today, she would not give me the details other then its regarding her RX.

## 2020-02-27 NOTE — Telephone Encounter (Signed)
Returned patients call.   Patient reports she had BV and was treated in June. She reports she has a foul odor to her Vagina but no discharge, burning, or itching. Advised patient she needs to come in for a swab to determine if she has an infection. Patient voiced understanding.   Message to front desk to schedule pt for nurse visit for vaginal swab.

## 2020-02-28 ENCOUNTER — Ambulatory Visit: Payer: Medicare Other | Admitting: *Deleted

## 2020-02-28 ENCOUNTER — Ambulatory Visit (INDEPENDENT_AMBULATORY_CARE_PROVIDER_SITE_OTHER): Payer: Medicare Other | Admitting: *Deleted

## 2020-02-28 ENCOUNTER — Ambulatory Visit (HOSPITAL_BASED_OUTPATIENT_CLINIC_OR_DEPARTMENT_OTHER): Payer: Medicare Other

## 2020-02-28 ENCOUNTER — Other Ambulatory Visit: Payer: Self-pay

## 2020-02-28 ENCOUNTER — Other Ambulatory Visit (HOSPITAL_COMMUNITY)
Admission: RE | Admit: 2020-02-28 | Discharge: 2020-02-28 | Disposition: A | Discharge status: Home / Self Care | Payer: Medicare Other | Source: Ambulatory Visit | Attending: Obstetrics & Gynecology | Admitting: Obstetrics & Gynecology

## 2020-02-28 VITALS — BP 113/71 | HR 102

## 2020-02-28 DIAGNOSIS — O0930 Supervision of pregnancy with insufficient antenatal care, unspecified trimester: Secondary | ICD-10-CM

## 2020-02-28 DIAGNOSIS — B009 Herpesviral infection, unspecified: Secondary | ICD-10-CM

## 2020-02-28 DIAGNOSIS — Z641 Problems related to multiparity: Secondary | ICD-10-CM | POA: Insufficient documentation

## 2020-02-28 DIAGNOSIS — Z362 Encounter for other antenatal screening follow-up: Secondary | ICD-10-CM

## 2020-02-28 DIAGNOSIS — O98513 Other viral diseases complicating pregnancy, third trimester: Secondary | ICD-10-CM

## 2020-02-28 DIAGNOSIS — O09529 Supervision of elderly multigravida, unspecified trimester: Secondary | ICD-10-CM | POA: Insufficient documentation

## 2020-02-28 DIAGNOSIS — O0990 Supervision of high risk pregnancy, unspecified, unspecified trimester: Secondary | ICD-10-CM

## 2020-02-28 DIAGNOSIS — N898 Other specified noninflammatory disorders of vagina: Secondary | ICD-10-CM | POA: Diagnosis not present

## 2020-02-28 DIAGNOSIS — O0933 Supervision of pregnancy with insufficient antenatal care, third trimester: Secondary | ICD-10-CM | POA: Diagnosis not present

## 2020-02-28 DIAGNOSIS — O09523 Supervision of elderly multigravida, third trimester: Secondary | ICD-10-CM

## 2020-02-28 DIAGNOSIS — O99891 Other specified diseases and conditions complicating pregnancy: Secondary | ICD-10-CM

## 2020-02-28 DIAGNOSIS — O99012 Anemia complicating pregnancy, second trimester: Secondary | ICD-10-CM | POA: Insufficient documentation

## 2020-02-28 DIAGNOSIS — O0943 Supervision of pregnancy with grand multiparity, third trimester: Secondary | ICD-10-CM | POA: Diagnosis not present

## 2020-02-28 DIAGNOSIS — Z3A28 28 weeks gestation of pregnancy: Secondary | ICD-10-CM

## 2020-02-28 NOTE — Progress Notes (Signed)
Pt reports having vaginal odor x1 week. She denies discharge or irritation. Self swab obtained. Pt was advised that she will be notified of results and any treatment indicated via Mychart. She voiced understanding.

## 2020-02-28 NOTE — Progress Notes (Signed)
Agree with A & P. 

## 2020-02-29 ENCOUNTER — Other Ambulatory Visit: Payer: Self-pay | Admitting: *Deleted

## 2020-02-29 DIAGNOSIS — O09523 Supervision of elderly multigravida, third trimester: Secondary | ICD-10-CM

## 2020-03-01 LAB — CERVICOVAGINAL ANCILLARY ONLY
Bacterial Vaginitis (gardnerella): NEGATIVE
Candida Glabrata: NEGATIVE
Candida Vaginitis: NEGATIVE
Chlamydia: NEGATIVE
Comment: NEGATIVE
Comment: NEGATIVE
Comment: NEGATIVE
Comment: NEGATIVE
Comment: NEGATIVE
Comment: NORMAL
Neisseria Gonorrhea: NEGATIVE
Trichomonas: NEGATIVE

## 2020-03-06 ENCOUNTER — Other Ambulatory Visit: Payer: Medicare Other

## 2020-03-06 ENCOUNTER — Encounter: Payer: Self-pay | Admitting: Obstetrics & Gynecology

## 2020-03-06 ENCOUNTER — Other Ambulatory Visit: Payer: Self-pay | Admitting: General Practice

## 2020-03-06 ENCOUNTER — Encounter: Payer: Medicare Other | Admitting: Obstetrics & Gynecology

## 2020-03-06 DIAGNOSIS — O0990 Supervision of high risk pregnancy, unspecified, unspecified trimester: Secondary | ICD-10-CM

## 2020-03-10 ENCOUNTER — Other Ambulatory Visit: Payer: Self-pay

## 2020-03-10 ENCOUNTER — Encounter (HOSPITAL_COMMUNITY): Payer: Self-pay | Admitting: *Deleted

## 2020-03-10 ENCOUNTER — Ambulatory Visit (HOSPITAL_COMMUNITY)
Admission: EM | Admit: 2020-03-10 | Discharge: 2020-03-10 | Disposition: A | Discharge status: Home / Self Care | Payer: Medicare Other | Attending: Emergency Medicine | Admitting: Emergency Medicine

## 2020-03-10 DIAGNOSIS — N898 Other specified noninflammatory disorders of vagina: Secondary | ICD-10-CM | POA: Diagnosis not present

## 2020-03-10 NOTE — Discharge Instructions (Signed)
We will notify of you any positive findings or if any changes to treatment are needed. If normal or otherwise without concern to your results, we will not call you. Please log on to your MyChart to review your results if interested in so.   

## 2020-03-10 NOTE — ED Provider Notes (Signed)
[place]    CSN: [id number] Arrival date & time: 03/10/20  1306      History   Chief Complaint Chief Complaint  Patient presents with   Vaginal Discharge    HPI Renee Blair is a 39 y.o. female.   Renee Blair presents with complaints of vaginal discharge. Fishy odor. History of BV and feels similar. Discharge is thick, unknown color. No itching. Pregnant, 30 weeks. No vaginal bleeding. No abdominal pain. Good fetal movement. No concern for stds. Next OB 9/6. Was seen 7/28 for same, cytology was negative at that time therefore no medications given.    ROS per HPI, negative if not otherwise mentioned.      Past Medical History:  Diagnosis Date   Anemia     Patient Active Problem List   Diagnosis Date Noted   Anemia in pregnancy 01/18/2020   Grand multiparity 01/08/2020   HSV (herpes simplex virus) infection 01/04/2020   Chronic knee pain 01/04/2020   Supervision of high risk pregnancy, antepartum 12/27/2019   AMA (advanced maternal age) multigravida 35+ 12/27/2019   Late prenatal care 12/27/2019    Past Surgical History:  Procedure Laterality Date   NO PAST SURGERIES      OB History    Gravida  8   Para  4   Term  4   Preterm      AB  3   Living  4     SAB  1   TAB  2   Ectopic      Multiple      Live Births  4            Home Medications    Prior to Admission medications   Medication Sig Start Date End Date Taking? Authorizing Provider  ferrous sulfate (FERROUSUL) 325 (65 FE) MG tablet Take 1 tablet (325 mg total) by mouth 2 (two) times daily. 02/21/20  Yes Sloan Leiter, MD  Prenatal 27-1 MG TABS Take 1 tablet by mouth daily. 02/21/20  Yes Sloan Leiter, MD  acetaminophen (TYLENOL) 500 MG tablet Take 1 tablet (500 mg total) by mouth every 6 (six) hours as needed for mild pain or moderate pain. 02/22/20   Domenic Moras, PA-C  amoxicillin-clavulanate (AUGMENTIN) 875-125 MG tablet Take 1 tablet by mouth 2  (two) times daily. One po bid x 7 days 02/22/20   Domenic Moras, PA-C  Blood Pressure Monitoring (BLOOD PRESSURE KIT) DEVI 1 Device by Does not apply route as needed. 02/21/20   Sloan Leiter, MD    Family History Family History  Problem Relation Age of Onset   Hypertension Mother    Diabetes Mother     Social History Social History   Tobacco Use   Smoking status: Former Smoker    Types: Cigars    Quit date: 01/25/2019    Years since quitting: 1.1   Smokeless tobacco: Never Used  Vaping Use   Vaping Use: Never used  Substance Use Topics   Alcohol use: Not Currently   Drug use: Never     Allergies   Patient has no known allergies.   Review of Systems Review of Systems   Physical Exam Triage Vital Signs ED Triage Vitals  Enc Vitals Group     BP 03/10/20 1357 118/77     Pulse Rate 03/10/20 1357 88     Resp 03/10/20 1357 18     Temp 03/10/20 1357 98.4 F (36.9 C)     Temp  Source 03/10/20 1357 Oral     SpO2 03/10/20 1357 100 %     Weight --      Height --      Head Circumference --      Peak Flow --      Pain Score 03/10/20 1356 0     Pain Loc --      Pain Edu? --      Excl. in [place]? --    No data found.  Updated Vital Signs BP 118/77    Pulse 88    Temp 98.4 F (36.9 C) (Oral)    Resp 18    LMP 08/04/2019    SpO2 100%   Visual Acuity Right Eye Distance:   Left Eye Distance:   Bilateral Distance:    Right Eye Near:   Left Eye Near:    Bilateral Near:     Physical Exam Constitutional:      General: She is not in acute distress.    Appearance: She is well-developed.  Cardiovascular:     Rate and Rhythm: Normal rate.  Pulmonary:     Effort: Pulmonary effort is normal.  Abdominal:     Palpations: Abdomen is not rigid.     Tenderness: There is no abdominal tenderness. There is no guarding or rebound.  Genitourinary:    Comments: Denies sores, lesions, vaginal bleeding; no pelvic pain; gu exam deferred at this time, vaginal self swab collected.    Skin:    General: Skin is warm and dry.  Neurological:     Mental Status: She is alert and oriented to person, place, and time.      UC Treatments / Results  Labs (all labs ordered are listed, but only abnormal results are displayed) Labs Reviewed  CERVICOVAGINAL ANCILLARY ONLY    EKG   Radiology No results found.  Procedures Procedures (including critical care time)  Medications Ordered in UC Medications - No data to display  Initial Impression / Assessment and Plan / UC Course  I have reviewed the triage vital signs and the nursing notes.  Pertinent labs & imaging results that were available during my care of the patient were reviewed by me and considered in my medical decision making (see chart for details).     Patient requesting empiric treatment for BV. Tested negative 7/28, she is [redacted] weeks pregnant, discussed with patient that I will await testing results before treatment to avoid unnecessary medications. This was unsatisfying news to her. We will notify her of any positive test findings and promptly initiate appropriate medications. Encouraged to follow up with gyne prn. Patient verbalized understanding.    Final Clinical Impressions(s) / UC Diagnoses   Final diagnoses:  Vaginal discharge     Discharge Instructions     We will notify of you any positive findings or if any changes to treatment are needed. If normal or otherwise without concern to your results, we will not call you. Please log on to your MyChart to review your results if interested in so.       ED Prescriptions    None     PDMP not reviewed this encounter.   Zigmund Gottron, NP 03/10/20 2241

## 2020-03-10 NOTE — ED Triage Notes (Signed)
Pt is [redacted] wks pregnant.  C/O malodorous vaginal discharge x approx 1 wk.  Denies any abd pain or vaginal bleeding.  Denies fevers.

## 2020-03-11 LAB — CERVICOVAGINAL ANCILLARY ONLY
Bacterial Vaginitis (gardnerella): NEGATIVE
Candida Glabrata: NEGATIVE
Candida Vaginitis: NEGATIVE
Chlamydia: NEGATIVE
Comment: NEGATIVE
Comment: NEGATIVE
Comment: NEGATIVE
Comment: NEGATIVE
Comment: NEGATIVE
Comment: NORMAL
Neisseria Gonorrhea: NEGATIVE
Trichomonas: NEGATIVE

## 2020-04-10 ENCOUNTER — Ambulatory Visit: Payer: Medicare Other

## 2020-04-10 ENCOUNTER — Ambulatory Visit: Payer: Medicare Other | Attending: Obstetrics and Gynecology

## 2020-04-16 ENCOUNTER — Encounter: Payer: Self-pay | Admitting: General Practice

## 2021-11-04 IMAGING — US US MFM OB FOLLOW-UP
1 series · 13 of 28 positions shown · non-contrast
Comparison: none

[Series 1: us mfm ob follow-up · 13 of 61 slices shown]
[im 3/61]
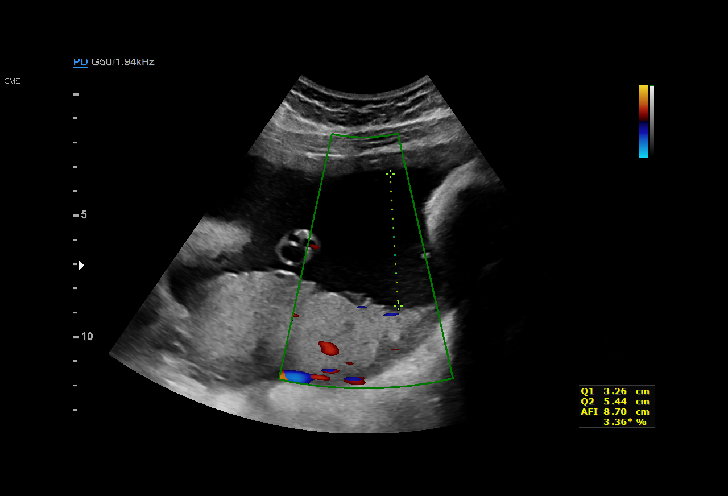
[im 7/61]
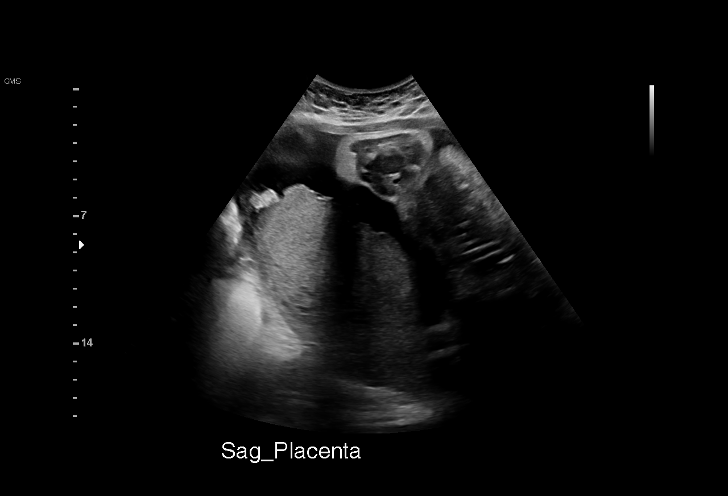
[im 12/61]
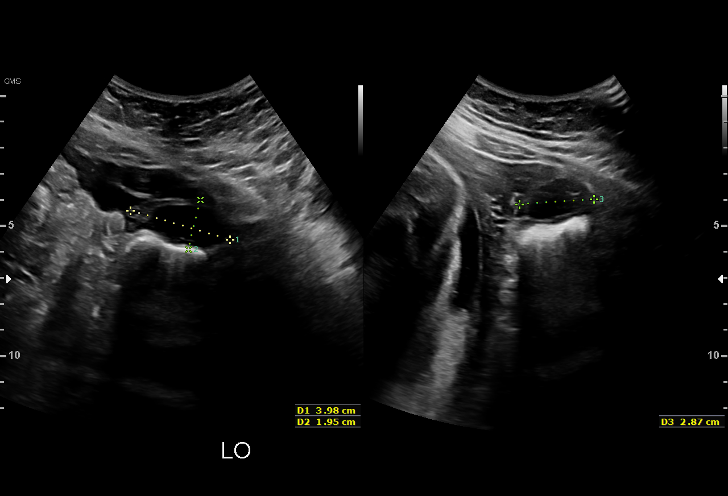
[im 16/61]
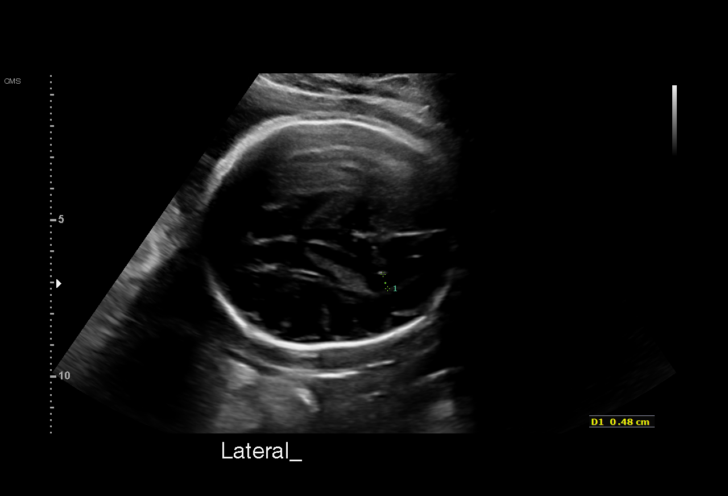
[im 21/61]
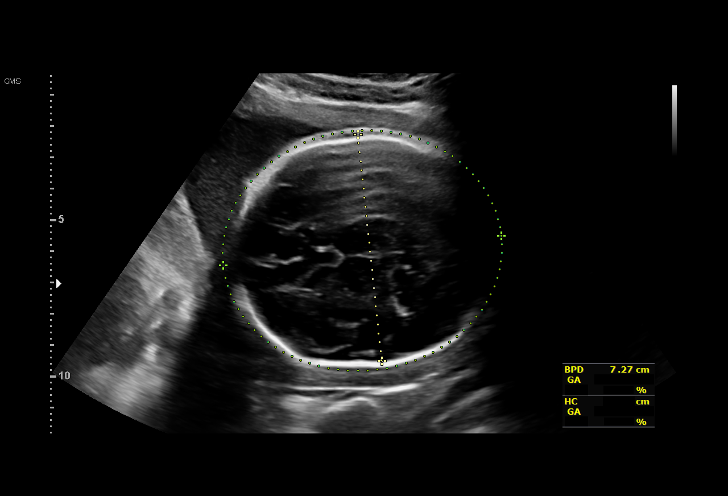
[im 25/61]
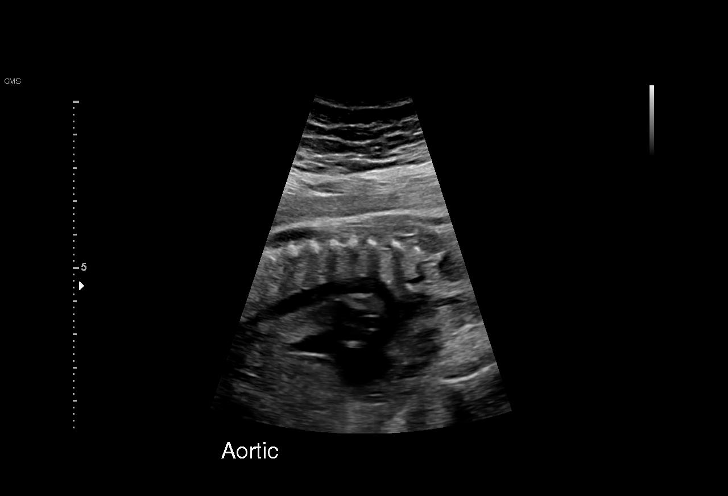
[im 32/61]
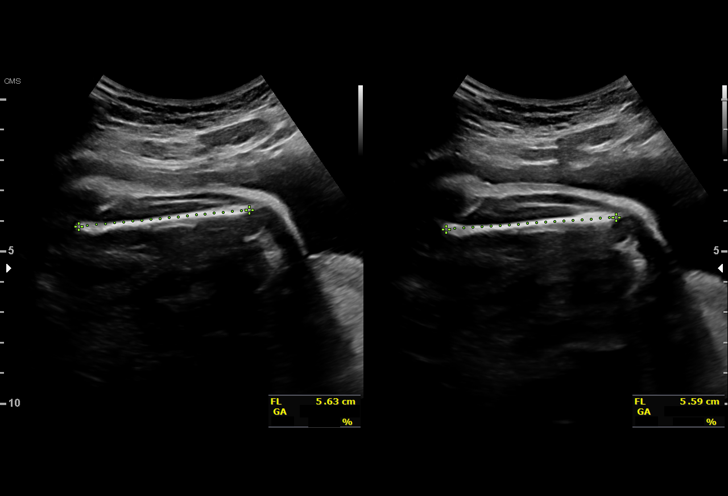
[im 36/61]
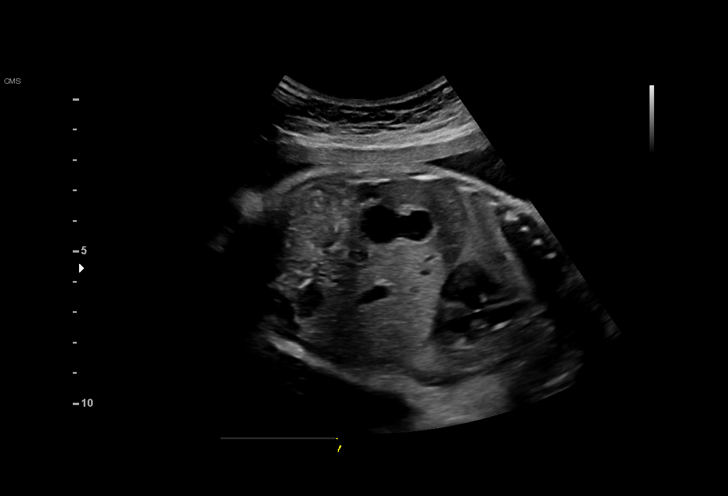
[im 41/61]
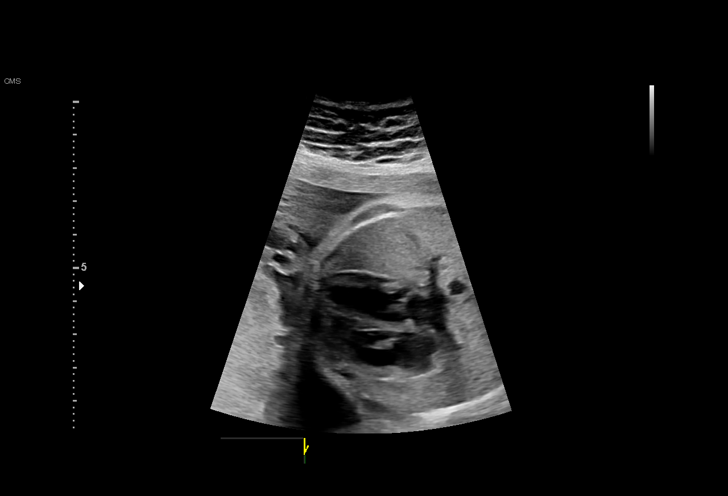
[im 45/61]
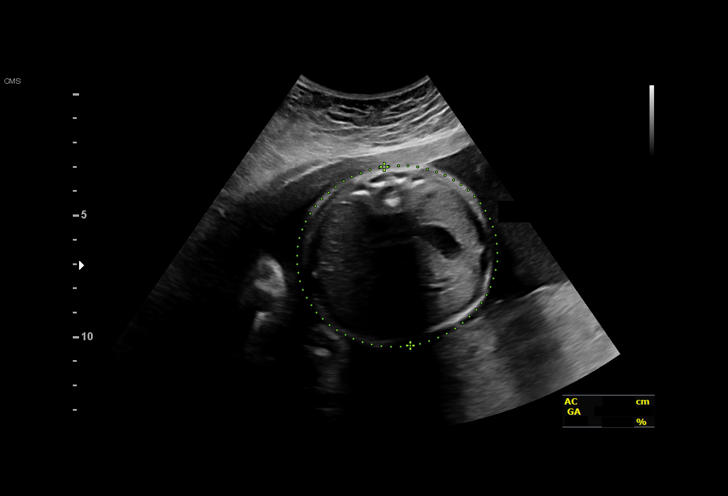
[im 49/61]
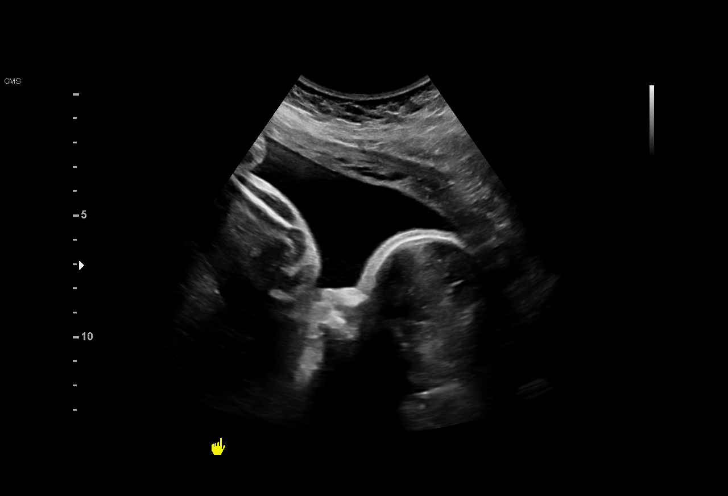
[im 54/61]
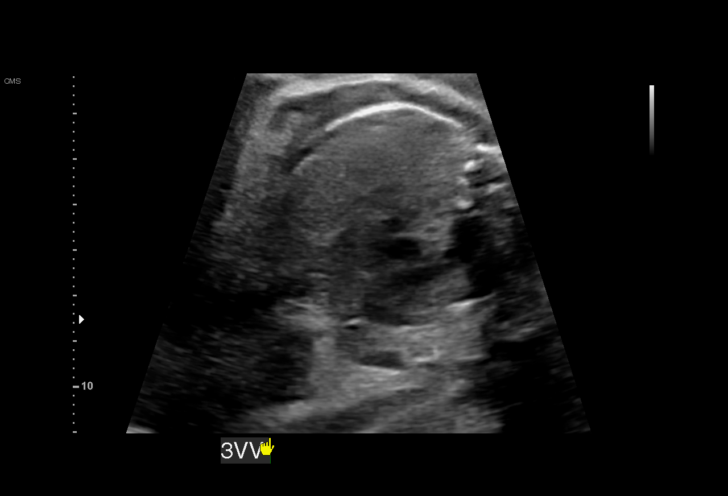
[im 58/61]
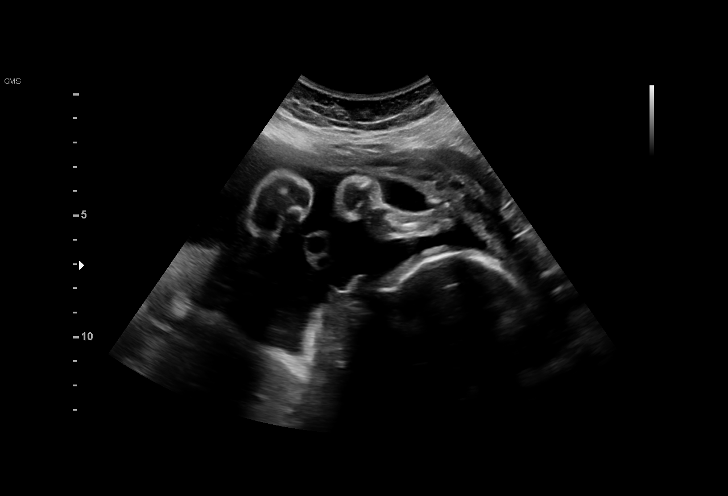

[13 of 28 positions shown; findings below may reference images not displayed]

BOOMSNIPER NP

                                                      WILCH[place]

Indications

 Encounter for other antenatal screening
 follow-up
 Advanced maternal age multigravida 35+,
 third trimester
 Insufficient Prenatal Care (Started care at
 21w)
 Grand multiparity, antepartum
 Herpes simplex virus (TABBY)
 28 weeks gestation of pregnancy
Vital Signs

                                                Height:        5'2"
Fetal Evaluation

 Num Of Fetuses:         1
 Fetal Heart Rate(bpm):  140
 Cardiac Activity:       Observed
 Presentation:           Cephalic
 Placenta:               Posterior
 P. Cord Insertion:      Previously Visualized

 Amniotic Fluid
 AFI FV:      Within normal limits

 AFI Sum(cm)     %Tile       Largest Pocket(cm)
 16.5            60
 RUQ(cm)       RLQ(cm)       LUQ(cm)        LLQ(cm)

Biometry

 BPD:      72.7  mm     G. Age:  29w 1d         58  %    CI:        75.91   %    70 - 86
                                                         FL/HC:      21.2   %    19.6 -
 HC:      264.5  mm     G. Age:  28w 6d         24  %    HC/AC:      1.07        0.99 -
 AC:      247.6  mm     G. Age:  29w 0d         56  %    FL/BPD:     77.2   %    71 - 87
 FL:       56.1  mm     G. Age:  29w 4d         62  %    FL/AC:      22.7   %    20 - 24
 HUM:      47.6  mm     G. Age:  28w 0d         33  %

 Est. FW:    7233  gm    2 lb 15 oz      59  %
OB History

 Gravidity:    8         Term:   4         SAB:   1
 TOP:          2        Living:  4
Gestational Age

 LMP:           29w 5d        Date:  08/04/19                 EDD:   05/10/20
 U/S Today:     29w 1d                                        EDD:   05/14/20
 Best:          28w 4d     Det. By:  U/S  (01/25/20)          EDD:   05/18/20
Anatomy

 Cranium:               Appears normal         Aortic Arch:            Appears normal
 Cavum:                 Appears normal         Ductal Arch:            Not well visualized
 Ventricles:            Appears normal         Diaphragm:              Appears normal
 Choroid Plexus:        Appears normal         Stomach:                Appears normal, left
                                                                       sided
 Cerebellum:            Appears normal         Abdomen:                Appears normal
 Posterior Fossa:       Appears normal         Abdominal Wall:         Appears nml (cord
                                                                       insert, abd wall)
 Nuchal Fold:           Not applicable (>20    Cord Vessels:           Appears normal (3
                        wks GA)                                        vessel cord)
 Face:                  Appears normal         Kidneys:                Appear normal
                        (orbits and profile)
 Lips:                  Appears normal         Bladder:                Appears normal
 Thoracic:              Appears normal         Spine:                  Previously seen
 Heart:                 Appears normal         Upper Extremities:      Previously seen
                        (4CH, axis, and
                        situs)
 RVOT:                  Appears normal         Lower Extremities:      Previously seen
 LVOT:                  Appears normal

 Other:  Female gender previously seen.Heels and 5th digit previously
         visualized. Technically difficult due to fetal position.
Cervix Uterus Adnexa

 Cervix
 Not visualized (advanced GA >10wks)

 Uterus
 No abnormality visualized.
 Right Ovary
 Within normal limits.

 Left Ovary
 Within normal limits.

 Cul De Sac
 No free fluid seen.

 Adnexa
 No abnormality visualized.
Impression

 Patient returned for completion of fetal anatomy .Amniotic
 fluid is normal and good fetal activity is seen .Fetal growth is
 appropriate for gestational age .Fetal anatomical survey was
 completed and appears normal.
Recommendations

 -An appointment was made for her to return in 6 weeks for
 fetal growth assessment (AMA and maternal obesity)
                 Ceejay, Paulus N

## 2023-08-20 ENCOUNTER — Emergency Department (HOSPITAL_COMMUNITY): Payer: Medicare Other

## 2023-08-20 ENCOUNTER — Other Ambulatory Visit: Payer: Self-pay

## 2023-08-20 ENCOUNTER — Emergency Department (HOSPITAL_COMMUNITY)
Admission: EM | Admit: 2023-08-20 | Discharge: 2023-08-20 | Disposition: A | Discharge status: Home / Self Care | Payer: Medicare Other | Attending: Emergency Medicine | Admitting: Emergency Medicine

## 2023-08-20 ENCOUNTER — Encounter (HOSPITAL_COMMUNITY): Payer: Self-pay | Admitting: *Deleted

## 2023-08-20 DIAGNOSIS — S0101XA Laceration without foreign body of scalp, initial encounter: Secondary | ICD-10-CM | POA: Diagnosis not present

## 2023-08-20 DIAGNOSIS — W009XXA Unspecified fall due to ice and snow, initial encounter: Secondary | ICD-10-CM | POA: Insufficient documentation

## 2023-08-20 DIAGNOSIS — Z23 Encounter for immunization: Secondary | ICD-10-CM | POA: Diagnosis not present

## 2023-08-20 DIAGNOSIS — W19XXXA Unspecified fall, initial encounter: Secondary | ICD-10-CM

## 2023-08-20 DIAGNOSIS — S0990XA Unspecified injury of head, initial encounter: Secondary | ICD-10-CM | POA: Diagnosis present

## 2023-08-20 LAB — URINALYSIS, ROUTINE W REFLEX MICROSCOPIC
Bacteria, UA: NONE SEEN
Bilirubin Urine: NEGATIVE
Glucose, UA: NEGATIVE mg/dL
Ketones, ur: NEGATIVE mg/dL
Nitrite: NEGATIVE
Protein, ur: 100 mg/dL — AB
RBC / HPF: >50 RBC/hpf (ref 0–5)
Specific Gravity, Urine: 1.026 (ref 1.005–1.030)
WBC, UA: >50 WBC/hpf (ref 0–5)
pH: 5 (ref 5.0–8.0)

## 2023-08-20 MED ORDER — HYDROCODONE-ACETAMINOPHEN 5-325 MG PO TABS
1.0000 | ORAL_TABLET | Freq: Once | ORAL | Status: AC
  Administered 2023-08-20: 1 via ORAL
  Filled 2023-08-20: qty 1

## 2023-08-20 MED ORDER — LIDOCAINE-EPINEPHRINE (PF) 2 %-1:200000 IJ SOLN
10.0000 mL | Freq: Once | INTRAMUSCULAR | Status: AC
  Administered 2023-08-20: 10 mL
  Filled 2023-08-20: qty 20

## 2023-08-20 MED ORDER — TETANUS-DIPHTH-ACELL PERTUSSIS 5-2.5-18.5 LF-MCG/0.5 IM SUSY
0.5000 mL | PREFILLED_SYRINGE | Freq: Once | INTRAMUSCULAR | Status: AC
  Administered 2023-08-20: 0.5 mL via INTRAMUSCULAR
  Filled 2023-08-20: qty 0.5

## 2023-08-20 MED ORDER — BACITRACIN ZINC 500 UNIT/GM EX OINT: TOPICAL_OINTMENT | Freq: Two times a day (BID) | CUTANEOUS | Status: DC

## 2023-08-20 NOTE — ED Provider Notes (Signed)
[place] [facility] AT [facility] Provider Note   CSN: [id number] Arrival date & time: 08/20/23  4098     History  Chief Complaint  Patient presents with   Fall   Laceration    Renee Blair is a 43 y.o. female history of anemia presented after mechanical fall that occurred earlier today.  Patient slipped on ice and hit the back of her head.  Patient denies neck pain or vision changes or LOC or blood thinners but states that she does have a cut on the top of her head.  Patient states that she does have pain like pain meds.  Patient also states that she has had urinary discomfort but no abdominal pain nausea vomiting or vaginal discharge or blood in her urine but would like to check her urine.  Patient also states she has history of anemia and would like her CBC checked.  Patient denies recent fevers, paresthesias, new onset weakness, chest pain, shortness of breath.  Patient is unsure of last tetanus.  Home Medications Prior to Admission medications   Medication Sig Start Date End Date Taking? Authorizing Provider  acetaminophen (TYLENOL) 500 MG tablet Take 1 tablet (500 mg total) by mouth every 6 (six) hours as needed for mild pain or moderate pain. 02/22/20   Fayrene Helper, PA-C  amoxicillin-clavulanate (AUGMENTIN) 875-125 MG tablet Take 1 tablet by mouth 2 (two) times daily. One po bid x 7 days 02/22/20   Fayrene Helper, PA-C  Blood Pressure Monitoring (BLOOD PRESSURE KIT) DEVI 1 Device by Does not apply route as needed. 02/21/20   Conan Bowens, MD  ferrous sulfate (FERROUSUL) 325 (65 FE) MG tablet Take 1 tablet (325 mg total) by mouth 2 (two) times daily. 02/21/20   Conan Bowens, MD  Prenatal 27-1 MG TABS Take 1 tablet by mouth daily. 02/21/20   Conan Bowens, MD      Allergies    Patient has no known allergies.    Review of Systems   Review of Systems  Physical Exam Updated Vital Signs BP 116/80 (BP Location: Right Arm)   Pulse 82   Temp 98 F (36.7 C)  (Oral)   Resp 18   Ht 5\' 2"  (1.575 m)   Wt 81.6 kg   SpO2 100%   BMI 32.92 kg/m  Physical Exam Vitals reviewed.  Constitutional:      General: She is not in acute distress. HENT:     Head: Normocephalic.     Comments: Approximately 3 cm laceration to crown of head that is not actively hemorrhaging and does not appear deep, no bony protrusions Eyes:     Extraocular Movements: Extraocular movements intact.     Conjunctiva/sclera: Conjunctivae normal.     Pupils: Pupils are equal, round, and reactive to light.  Cardiovascular:     Rate and Rhythm: Normal rate and regular rhythm.     Pulses: Normal pulses.     Heart sounds: Normal heart sounds.     Comments: 2+ bilateral radial/dorsalis pedis pulses with regular rate Pulmonary:     Effort: Pulmonary effort is normal. No respiratory distress.     Breath sounds: Normal breath sounds.  Abdominal:     Palpations: Abdomen is soft.     Tenderness: There is no abdominal tenderness. There is no guarding or rebound.  Musculoskeletal:        General: Normal range of motion.     Cervical back: Normal range of motion and neck supple.  Comments: 5 out of 5 bilateral grip/leg extension strength  Skin:    General: Skin is warm and dry.     Capillary Refill: Capillary refill takes less than 2 seconds.  Neurological:     General: No focal deficit present.     Mental Status: She is alert and oriented to person, place, and time.     Sensory: Sensation is intact.     Motor: Motor function is intact.     Coordination: Coordination is intact.     Gait: Gait is intact.     Comments: Sensation intact in all 4 limbs Cranial nerves III through XII intact Vision grossly intact  Psychiatric:        Mood and Affect: Mood normal.     ED Results / Procedures / Treatments   Labs (all labs ordered are listed, but only abnormal results are displayed) Labs Reviewed - No data to display  EKG None  Radiology No results  found.  Procedures .Laceration Repair  Date/Time: 08/20/2023 9:01 AM  Performed by: Netta Corrigan, PA-C Authorized by: Netta Corrigan, PA-C   Consent:    Consent obtained:  Verbal   Consent given by:  Patient   Risks, benefits, and alternatives were discussed: yes     Risks discussed:  Infection, need for additional repair, nerve damage, poor wound healing, poor cosmetic result, pain, retained foreign body, tendon damage and vascular damage Universal protocol:    Procedure explained and questions answered to patient or proxy's satisfaction: yes     Imaging studies available: yes     Patient identity confirmed:  Verbally with patient Anesthesia:    Anesthesia method:  Local infiltration   Local anesthetic:  Lidocaine 2% WITH epi Laceration details:    Location:  Scalp   Scalp location:  Crown   Length (cm):  3   Depth (mm):  2 Treatment:    Area cleansed with:  Saline   Amount of cleaning:  Standard   Irrigation solution:  Sterile saline   Irrigation volume:  100 mL   Irrigation method:  Pressure wash   Visualized foreign bodies/material removed: no     Debridement:  None Skin repair:    Repair method:  Staples   Number of staples:  5 Approximation:    Approximation:  Close Repair type:    Repair type:  Simple Post-procedure details:    Dressing:  Antibiotic ointment, bulky dressing and non-adherent dressing   Procedure completion:  Tolerated     Medications Ordered in ED Medications - No data to display  ED Course/ Medical Decision Making/ A&P                                 Medical Decision Making Amount and/or Complexity of Data Reviewed Labs: ordered. Radiology: ordered.  Risk OTC drugs. Prescription drug management.   Renee Blair 43 y.o. presented today for fall. Working DDx that I considered at this time includes, but not limited to, vasovagal episode, mechanical fall, ICH, epidural/subdural hematoma, basilar skull fracture, anemia,  electrolyte abnormalities, drug-induced, arrhythmia, UTI, fracture, contusion, soft tissue injury.  R/o DDx: vasovagal episode, ICH, epidural/subdural hematoma, basilar skull fracture, anemia, electrolyte abnormalities, drug-induced, arrhythmia, fracture: These are considered less likely due to history of present illness, physical exam, lab/imaging findings  Review of prior external notes: 03/10/2020 ED  Unique Tests and My Interpretation:  CT head without contrast: No acute findings UA: Unremarkable  Social Determinants of Health: none  Discussion with Independent Historian: None  Discussion of Management of Tests: None  Risk: Medium: prescription drug management  Risk Stratification Score: None  Plan: On exam patient was no acute distress with stable vitals.  Patient did have a laceration to the upper head but had reassuring neurologic exam but will get CT to rule out other pathology.  Patient is unsure of last tetanus so we will update this as well.  Patient stated that she wanted to have her blood count checked along with a urine and she states she has history of anemia and wants to rule that out as the cause for her fall along with having urinary discomfort and wants make sure she does not have a UTI.  These are reasonable we will get these however does sound as if patient had mechanical fall due to the ice today and so we will repair laceration with standard protocol.  Patient stated that she no longer wanted blood work to check her blood counts only to follow-up with her primary care provider which is reasonable.  After wound was cleaned by nursing patient had 5 staples placed to which she tolerated.  I spoke to the patient by having the staples removed in 7 to 10 days any other urgent care, primary care's office, ED.  I discussed with patient red flag symptoms including fevers, pus, fluctuance, etc. and other indications to return to the ED sooner.  Patient verbalized understanding  symptoms of this.  I discussed wound care with the patient to which she verbalized understanding acceptance of.  CT scan was negative.  Patient is requesting to be discharged before the UA is back she states she denies [place].  I discussed with the patient following up on the MyChart results and that we will call if there are any acute abnormalities to which patient verbalized understanding acceptance of with the nurse present as witness.  Will discharge with primary care follow-up.  UA does not show any signs of bacteria or UTI and so will not prescribe any antibiotics at this time.  Patient was given return precautions. Patient stable for discharge at this time.  Patient verbalized understanding of plan.  This chart was dictated using voice recognition software.  Despite best efforts to proofread,  errors can occur which can change the documentation meaning.         Final Clinical Impression(s) / ED Diagnoses Final diagnoses:  None    Rx / DC Orders ED Discharge Orders     None         Remi Deter 08/20/23 1005    Linwood Dibbles, MD 08/21/23 [phone]

## 2023-08-20 NOTE — Discharge Instructions (Signed)
Please follow-up with the primary care provider have attached your for you today.  Today your imaging was reassuring and your urine is reassuring as well.  Your laceration was fixed with 5 staples that you need to have removed in 7 to 10 days.  If you begin to have fevers, areas of fluctuance, pus drainage or other worsening symptoms please return to the ER.  You may take Tylenol every 6 hours needed for pain.

## 2023-08-20 NOTE — ED Triage Notes (Addendum)
BIB [facility] from home, s/p slipped on ice, fell, hit head. ~3cm lac to top of head. H/o anemia, no meds or allergies. No LOC or blood thinners. Dizziness initially with walking which has resolved, but has had recently. Endorses anemia and dizziness could be r/t fall.  "Now just pain". Steady gait. VSS. Minimal blood loss "50ml". Bleeding controlled with dressing. Alert, NAD, calm, interactive. Up to b/r upon arrival. "Wants anemia and urine checked".

## 2023-08-20 NOTE — ED Notes (Signed)
Pt had called a cab and advised she could not wait. Vitals and going over discharge paperwork not obtained due to this.

## 2023-08-20 NOTE — ED Notes (Signed)
Pt refused blood work advised she would see her PCP
